# Patient Record
Sex: Female | Born: 1950 | Race: White | Hispanic: No | Marital: Single | Smoking: Current every day smoker
Health system: Southern US, Community
[De-identification: ages and names within clinical notes are randomized; demographics above are authoritative.]

## PROBLEM LIST (undated history)

## (undated) DIAGNOSIS — I10 Essential (primary) hypertension: Secondary | ICD-10-CM

## (undated) DIAGNOSIS — I509 Heart failure, unspecified: Secondary | ICD-10-CM

## (undated) HISTORY — PX: TONSILLECTOMY: SUR1361

## (undated) HISTORY — PX: ABDOMINAL HYSTERECTOMY: SHX81

---

## 2020-11-28 ENCOUNTER — Other Ambulatory Visit: Payer: Self-pay

## 2020-11-28 ENCOUNTER — Encounter (HOSPITAL_BASED_OUTPATIENT_CLINIC_OR_DEPARTMENT_OTHER): Payer: Self-pay | Admitting: *Deleted

## 2020-11-28 ENCOUNTER — Emergency Department (HOSPITAL_BASED_OUTPATIENT_CLINIC_OR_DEPARTMENT_OTHER)
Admission: EM | Admit: 2020-11-28 | Discharge: 2020-11-29 | Disposition: A | Discharge status: Home / Self Care | Payer: Medicare HMO | Attending: Emergency Medicine | Admitting: Emergency Medicine

## 2020-11-28 ENCOUNTER — Emergency Department (HOSPITAL_BASED_OUTPATIENT_CLINIC_OR_DEPARTMENT_OTHER): Payer: Medicare HMO

## 2020-11-28 DIAGNOSIS — I509 Heart failure, unspecified: Secondary | ICD-10-CM | POA: Diagnosis not present

## 2020-11-28 DIAGNOSIS — F17210 Nicotine dependence, cigarettes, uncomplicated: Secondary | ICD-10-CM | POA: Insufficient documentation

## 2020-11-28 DIAGNOSIS — R791 Abnormal coagulation profile: Secondary | ICD-10-CM

## 2020-11-28 DIAGNOSIS — I87311 Chronic venous hypertension (idiopathic) with ulcer of right lower extremity: Secondary | ICD-10-CM | POA: Insufficient documentation

## 2020-11-28 DIAGNOSIS — R609 Edema, unspecified: Secondary | ICD-10-CM

## 2020-11-28 DIAGNOSIS — L97919 Non-pressure chronic ulcer of unspecified part of right lower leg with unspecified severity: Secondary | ICD-10-CM | POA: Diagnosis not present

## 2020-11-28 DIAGNOSIS — I110 Hypertensive heart disease with heart failure: Secondary | ICD-10-CM | POA: Insufficient documentation

## 2020-11-28 DIAGNOSIS — I872 Venous insufficiency (chronic) (peripheral): Secondary | ICD-10-CM

## 2020-11-28 DIAGNOSIS — R2241 Localized swelling, mass and lump, right lower limb: Secondary | ICD-10-CM | POA: Diagnosis present

## 2020-11-28 DIAGNOSIS — R600 Localized edema: Secondary | ICD-10-CM

## 2020-11-28 HISTORY — DX: Essential (primary) hypertension: I10

## 2020-11-28 HISTORY — DX: Heart failure, unspecified: I50.9

## 2020-11-28 LAB — COMPREHENSIVE METABOLIC PANEL
ALT: 68 U/L — ABNORMAL HIGH (ref 0–44)
AST: 75 U/L — ABNORMAL HIGH (ref 15–41)
Albumin: 3.2 g/dL — ABNORMAL LOW (ref 3.5–5.0)
Alkaline Phosphatase: 101 U/L (ref 38–126)
Anion gap: 10 (ref 5–15)
BUN: 20 mg/dL (ref 8–23)
CO2: 23 mmol/L (ref 22–32)
Calcium: 8.5 mg/dL — ABNORMAL LOW (ref 8.9–10.3)
Chloride: 96 mmol/L — ABNORMAL LOW (ref 98–111)
Creatinine, Ser: 1.06 mg/dL — ABNORMAL HIGH (ref 0.44–1.00)
GFR, Estimated: 57 mL/min — ABNORMAL LOW (ref >60)
Glucose, Bld: 144 mg/dL — ABNORMAL HIGH (ref 70–99)
Potassium: 5 mmol/L (ref 3.5–5.1)
Sodium: 129 mmol/L — ABNORMAL LOW (ref 135–145)
Total Bilirubin: 0.7 mg/dL (ref 0.3–1.2)
Total Protein: 6.5 g/dL (ref 6.5–8.1)

## 2020-11-28 LAB — CBC WITH DIFFERENTIAL/PLATELET
Abs Immature Granulocytes: 0.02 10*3/uL (ref 0.00–0.07)
Basophils Absolute: 0.1 10*3/uL (ref 0.0–0.1)
Basophils Relative: 1 %
Eosinophils Absolute: 0.1 10*3/uL (ref 0.0–0.5)
Eosinophils Relative: 1 %
HCT: 36.7 % (ref 36.0–46.0)
Hemoglobin: 12.2 g/dL (ref 12.0–15.0)
Immature Granulocytes: 0 %
Lymphocytes Relative: 11 %
Lymphs Abs: 0.9 10*3/uL (ref 0.7–4.0)
MCH: 31.8 pg (ref 26.0–34.0)
MCHC: 33.2 g/dL (ref 30.0–36.0)
MCV: 95.6 fL (ref 80.0–100.0)
Monocytes Absolute: 0.5 10*3/uL (ref 0.1–1.0)
Monocytes Relative: 7 %
Neutro Abs: 6.3 10*3/uL (ref 1.7–7.7)
Neutrophils Relative %: 80 %
Platelets: 224 10*3/uL (ref 150–400)
RBC: 3.84 MIL/uL — ABNORMAL LOW (ref 3.87–5.11)
RDW: 12.6 % (ref 11.5–15.5)
WBC: 7.9 10*3/uL (ref 4.0–10.5)
nRBC: 0 % (ref 0.0–0.2)

## 2020-11-28 LAB — BRAIN NATRIURETIC PEPTIDE: B Natriuretic Peptide: 4136.8 pg/mL — ABNORMAL HIGH (ref 0.0–100.0)

## 2020-11-28 LAB — TROPONIN I (HIGH SENSITIVITY): Troponin I (High Sensitivity): 27 ng/L — ABNORMAL HIGH (ref <18)

## 2020-11-28 MED ORDER — FUROSEMIDE 10 MG/ML IJ SOLN
40.0000 mg | Freq: Once | INTRAMUSCULAR | Status: AC
  Administered 2020-11-28: 40 mg via INTRAVENOUS
  Filled 2020-11-28: qty 4

## 2020-11-28 NOTE — ED Provider Notes (Signed)
[facility] EMERGENCY DEPARTMENT Provider Note   CSN: [id number] Arrival date & time: 11/28/20  2118     History Chief Complaint  Patient presents with   Leg Swelling    Nicole Dennis is a 70 y.o. female.  70 year old female who presents emerged part today secondary to lower extremity swelling and wound likely.  Patient states that she has been having trouble with her primary doctors and she states that she has not gotten a refill on her Lasix in quite a while.  She has been taking Coumadin, carvedilol and her blood pressure medication as per scheduled.  She also complains of nonhealing right leg wound.  She states that has a foul smell now.  No fever no spreading redness no other associated symptoms.  She denies any visual breath but is feeling a lot of anxiety right now. Her son is with her statign he is working on helping with health issues and possibly getting a new PCP.        Past Medical History:  Diagnosis Date   CHF (congestive heart failure) (HCC)    HTN (hypertension)     There are no problems to display for this patient.   Past Surgical History:  Procedure Laterality Date   ABDOMINAL HYSTERECTOMY     TONSILLECTOMY       OB History   No obstetric history on file.     No family history on file.  Social History   Tobacco Use   Smoking status: Every Day    Packs/day: 0.50    Types: Cigarettes  Substance Use Topics   Alcohol use: Not Currently    Home Medications Prior to Admission medications   Medication Sig Start Date End Date Taking? Authorizing Provider  cephALEXin (KEFLEX) 500 MG capsule Take 1 capsule (500 mg total) by mouth 4 (four) times daily. 11/29/20  Yes Naji Mehringer, Barbara Cower, MD  furosemide (LASIX) 40 MG tablet Take 1 tablet (40 mg total) by mouth daily. 11/29/20  Yes Jakelin Taussig, Barbara Cower, MD    Allergies    Patient has no known allergies.  Review of Systems   Review of Systems  All other systems reviewed and are negative.  Physical  Exam Updated Vital Signs BP (!) 163/87 (BP Location: Right Arm)   Pulse 87   Temp 97.9 F (36.6 C) (Oral)   Resp 18   Ht 5\' 5"  (1.651 m)   SpO2 98%   Physical Exam Vitals and nursing note reviewed.  Constitutional:      Appearance: She is well-developed.  HENT:     Head: Normocephalic and atraumatic.     Mouth/Throat:     Mouth: Mucous membranes are moist.     Pharynx: Oropharynx is clear.  Eyes:     Pupils: Pupils are equal, round, and reactive to light.  Cardiovascular:     Rate and Rhythm: Normal rate and regular rhythm.  Pulmonary:     Effort: No respiratory distress.     Breath sounds: No stridor.  Abdominal:     General: There is no distension.  Musculoskeletal:        General: Swelling present. No deformity or signs of injury. Normal range of motion.     Cervical back: Normal range of motion.  Skin:    Comments: 5x3 cm ulcer to left medial shin without erythema, ttp. Skin in same area is slightly bronzed c/w venous stasis.   Neurological:     General: No focal deficit present.     Mental  Status: She is alert.    ED Results / Procedures / Treatments   Labs (all labs ordered are listed, but only abnormal results are displayed) Labs Reviewed  CBC WITH DIFFERENTIAL/PLATELET - Abnormal; Notable for the following components:      Result Value   RBC 3.84 (*)    All other components within normal limits  COMPREHENSIVE METABOLIC PANEL - Abnormal; Notable for the following components:   Sodium 129 (*)    Chloride 96 (*)    Glucose, Bld 144 (*)    Creatinine, Ser 1.06 (*)    Calcium 8.5 (*)    Albumin 3.2 (*)    AST 75 (*)    ALT 68 (*)    GFR, Estimated 57 (*)    All other components within normal limits  BRAIN NATRIURETIC PEPTIDE - Abnormal; Notable for the following components:   B Natriuretic Peptide 4,136.8 (*)    All other components within normal limits  PROTIME-INR - Abnormal; Notable for the following components:   Prothrombin Time 47.7 (*)    INR  5.2 (*)    All other components within normal limits  TROPONIN I (HIGH SENSITIVITY) - Abnormal; Notable for the following components:   Troponin I (High Sensitivity) 27 (*)    All other components within normal limits  TROPONIN I (HIGH SENSITIVITY) - Abnormal; Notable for the following components:   Troponin I (High Sensitivity) 32 (*)    All other components within normal limits    EKG None  Radiology DG Chest 2 View  Result Date: 11/28/2020 CLINICAL DATA:  Shortness of breath. Bilateral leg swelling. Left leg swelling and redness with infection for 1 month. EXAM: CHEST - 2 VIEW COMPARISON:  Chest x-ray 05/31/2013, CT angio chest 05/28/2013 report without imaging FINDINGS: Enlarged cardiac silhouette. The heart and mediastinal contours are otherwise unremarkable. Aortic calcification. Hyperinflation. No focal consolidation. Increased interstitial markings. Trace pleural effusion. No pneumothorax. No acute osseous abnormality. IMPRESSION: Cardiomegaly with pulmonary edema and trace bilateral pleural effusions. Aortic Atherosclerosis (ICD10-I70.0). Electronically Signed   By: Tish Frederickson M.D.   On: 11/28/2020 22:08    Procedures Procedures   Medications Ordered in ED Medications  furosemide (LASIX) injection 40 mg (40 mg Intravenous Given 11/28/20 2336)  cefTRIAXone (ROCEPHIN) 2 g in sodium chloride 0.9 % 100 mL IVPB (0 g Intravenous Stopped 11/29/20 0229)    ED Course  I have reviewed the triage vital signs and the nursing notes.  Pertinent labs & imaging results that were available during my care of the patient were reviewed by me and considered in my medical decision making (see chart for details).    MDM Rules/Calculators/A&P                         Will diurese and start antibiotics. No obvious indications for admission currently . Will probably need to engage TOC to help with home health as well and wound care/medication management, PCP.   Significant diuresis here in ED.  Supratherapeutic INR, will hold for a couple days and then take it intermittently for a week and work on PCP follow up to follow INR's. TOC consult placed. Face to face orders placed.   Final Clinical Impression(s) / ED Diagnoses Final diagnoses:  Peripheral edema  Venous stasis ulcer of right calf limited to breakdown of skin without varicose veins (HCC)  Supratherapeutic INR    Rx / DC Orders ED Discharge Orders  Ordered    Home Health        11/29/20 [id number]    Face-to-face encounter (required for Medicare/Medicaid patients)       Comments: I Marily Memos certify that this patient is under my care and that I, or a nurse practitioner or physician's assistant working with me, had a face-to-face encounter that meets the physician face-to-face encounter requirements with this patient on 11/29/2020. The encounter with the patient was in whole, or in part for the following medical condition(s) which is the primary reason for home health care (List medical condition): stasis ulcer, edema, needs pcp and wound care. Further orders per same.   11/29/20 0224    cephALEXin (KEFLEX) 500 MG capsule  4 times daily        11/29/20 0224    furosemide (LASIX) 40 MG tablet  Daily        11/29/20 0224             Quinten Allerton, Barbara Cower, MD 11/29/20 [phone]

## 2020-11-28 NOTE — ED Notes (Signed)
Patient transported to X-ray 

## 2020-11-28 NOTE — ED Triage Notes (Signed)
C/o bil leg swelling , reports left with redness and infection x 1 month

## 2020-11-29 LAB — TROPONIN I (HIGH SENSITIVITY): Troponin I (High Sensitivity): 32 ng/L — ABNORMAL HIGH (ref <18)

## 2020-11-29 LAB — PROTIME-INR
INR: 5.2 (ref 0.8–1.2)
Prothrombin Time: 47.7 seconds — ABNORMAL HIGH (ref 11.4–15.2)

## 2020-11-29 MED ORDER — CEPHALEXIN 500 MG PO CAPS: 500.0000 mg | ORAL_CAPSULE | Freq: Four times a day (QID) | ORAL | 0 refills | Status: AC

## 2020-11-29 MED ORDER — FUROSEMIDE 40 MG PO TABS: 40.0000 mg | ORAL_TABLET | Freq: Every day | ORAL | 1 refills | Status: AC

## 2020-11-29 MED ORDER — SODIUM CHLORIDE 0.9 % IV SOLN
2.0000 g | Freq: Once | INTRAVENOUS | Status: AC
  Administered 2020-11-29: 2 g via INTRAVENOUS
  Filled 2020-11-29: qty 20

## 2020-11-29 NOTE — ED Notes (Signed)
Pts son at bedside during review of d/c instructions, prescriptions and follow up care. Patient and son both verbalize understanding of d/c instructions, prescriptions and follow up care.

## 2020-11-29 NOTE — Discharge Instructions (Addendum)
Please skip your dose of coumadin every other day for the next week. Please obtain follow up to recheck INR and get further management of same.

## 2020-11-29 NOTE — ED Notes (Signed)
Report given and care transferred to [facility]

## 2023-03-19 IMAGING — DX DG CHEST 2V
1 series · 1 of 1 positions shown · non-contrast
Comparison: Chest x-ray 05/31/2013, CT angio chest 05/28/2013
report without imaging

CLINICAL DATA: Shortness of breath. Bilateral leg swelling. Left
leg swelling and redness with infection for 1 month.

EXAM:
CHEST - 2 VIEW

[chest lat]
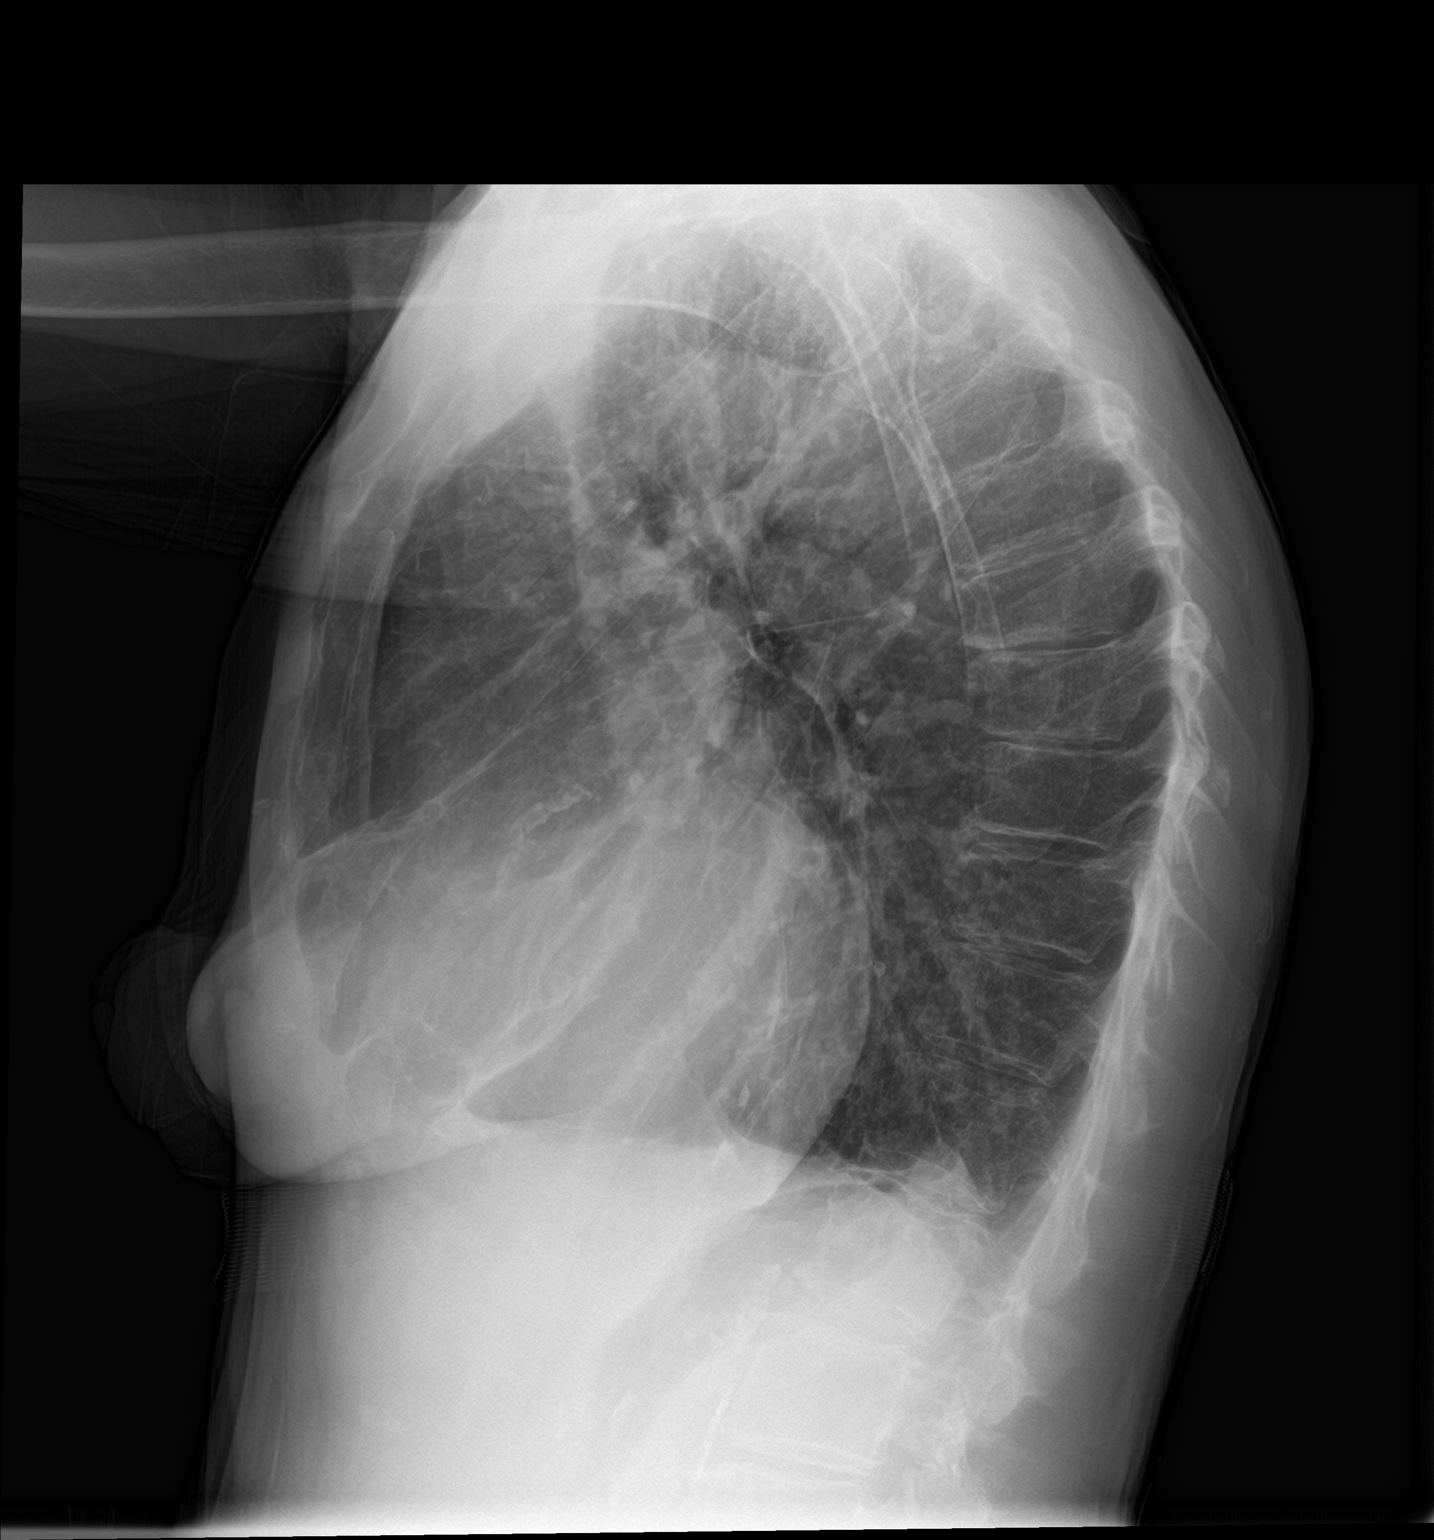

[1 of 1 positions shown; findings below may reference images not displayed]

FINDINGS: Enlarged cardiac silhouette. The heart and mediastinal contours are
otherwise unremarkable. Aortic calcification.

Hyperinflation. No focal consolidation. Increased interstitial
markings. Trace pleural effusion. No pneumothorax.

No acute osseous abnormality.
IMPRESSION: Cardiomegaly with pulmonary edema and trace bilateral pleural
effusions.

Aortic Atherosclerosis ([id number].P).
